# Patient Record
Sex: Female | Born: 1960
Health system: Southern US, Community
[De-identification: ages and names within clinical notes are randomized; demographics above are authoritative.]

## PROBLEM LIST (undated history)

## (undated) DIAGNOSIS — K219 Gastro-esophageal reflux disease without esophagitis: Secondary | ICD-10-CM

## (undated) DIAGNOSIS — D649 Anemia, unspecified: Secondary | ICD-10-CM

## (undated) DIAGNOSIS — K648 Other hemorrhoids: Secondary | ICD-10-CM

## (undated) DIAGNOSIS — I10 Essential (primary) hypertension: Secondary | ICD-10-CM

## (undated) DIAGNOSIS — I700 Atherosclerosis of aorta: Secondary | ICD-10-CM

## (undated) HISTORY — PX: PARTIAL HYSTERECTOMY: SHX80

## (undated) HISTORY — DX: Gastro-esophageal reflux disease without esophagitis: K21.9

## (undated) HISTORY — PX: LUMBAR DISC SURGERY: SHX700

## (undated) HISTORY — DX: Atherosclerosis of aorta: I70.0

## (undated) HISTORY — PX: LUMBAR FUSION: SHX111

## (undated) HISTORY — PX: BACK SURGERY: SHX140

## (undated) HISTORY — DX: Anemia, unspecified: D64.9

## (undated) HISTORY — DX: Essential (primary) hypertension: I10

## (undated) HISTORY — DX: Other hemorrhoids: K64.8

---

## 2004-06-21 ENCOUNTER — Encounter: Admission: RE | Admit: 2004-06-21 | Discharge: 2004-06-21 | Payer: Self-pay | Admitting: Family Medicine

## 2004-11-07 ENCOUNTER — Encounter: Admission: RE | Admit: 2004-11-07 | Discharge: 2004-11-07 | Payer: Self-pay | Admitting: Neurosurgery

## 2004-12-17 ENCOUNTER — Inpatient Hospital Stay (HOSPITAL_COMMUNITY): Admission: RE | Admit: 2004-12-17 | Discharge: 2004-12-22 | Payer: Self-pay | Admitting: Neurosurgery

## 2005-04-07 IMAGING — CT CT L SPINE W/ CM
3 of 7 series · 14 of 33 positions shown, 17 images · non-contrast
Comparison: none

CLINICAL DATA: Previous surgery on the right at L5-S1.  Small recurrent disk herniation.  The patient complains of low back pain and right leg pain. 
 DIAGNOSTIC LUMBAR DISK INJECTIONS:
 The procedure was discussed in depth with the patient including the potential risk of infection.  She received 1 gram of Ancef intravenously prior to the procedure with a small amount withdrawn and added to the contrast for injection.  She received 2 mg of Versed intravenously prior to the procedure.  She was placed prone on the fluoroscopic table.  A Betadine scrub of the low back was performed and the patient was draped in a sterile fashion.  Skin anesthesia was carried [DATE]% Lidocaine. 15 cc 22 gauge Blake San were directed into the nuclear regions of the disks at L4-5 and L5-S1 on the left.  Contrast mixed with Ancef was injected using a pressure monitoring syringe.  Spot radiographs were taken. The patient?s symptoms were recorded.  Following the procedure, she was treated with intravenous Toradol and Fentanyl.  She was taken to CT scan in good condition.  She was observed for an hour after the CT scan and discharged in good condition.

[Series 4: recon 3: l-spine helical · axial · 0.27mm/px · z∈[-261,-168]mm · 6 of 220 slices shown, 8 images]
[im 32/220  soft-tissue]
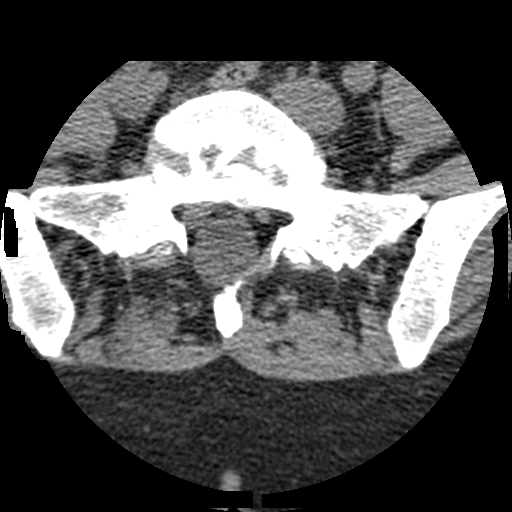
[im 32/220  bone]
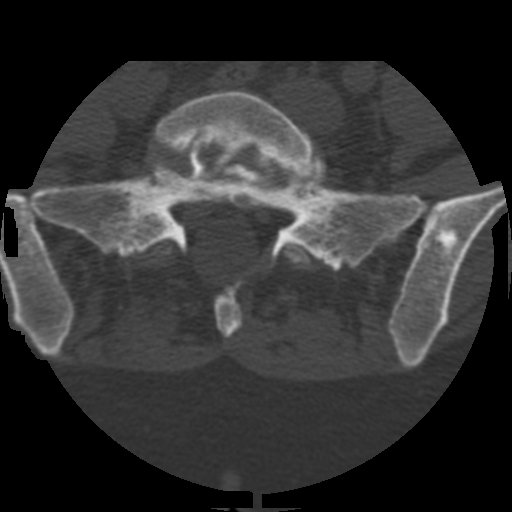
[im 63/220  bone]
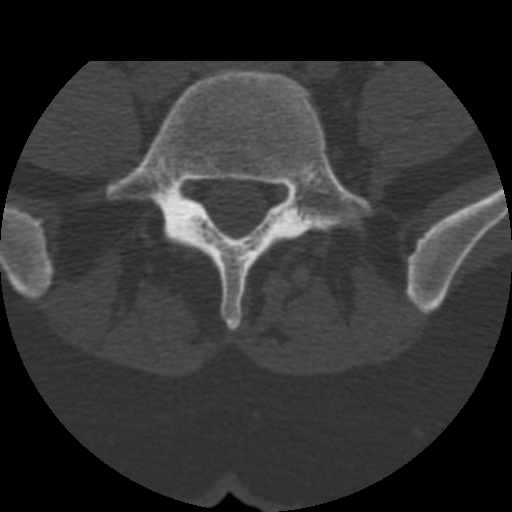
[im 94/220  bone]
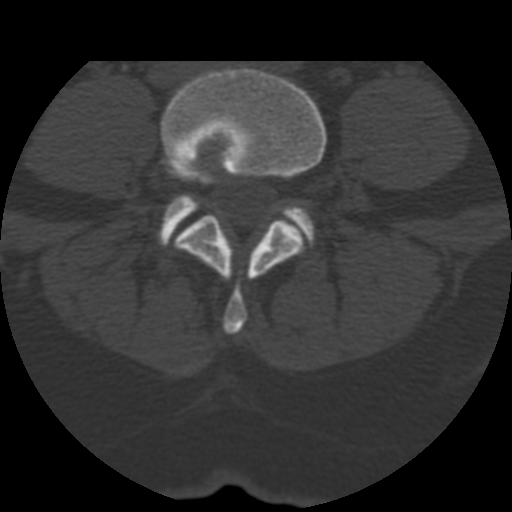
[im 126/220  bone]
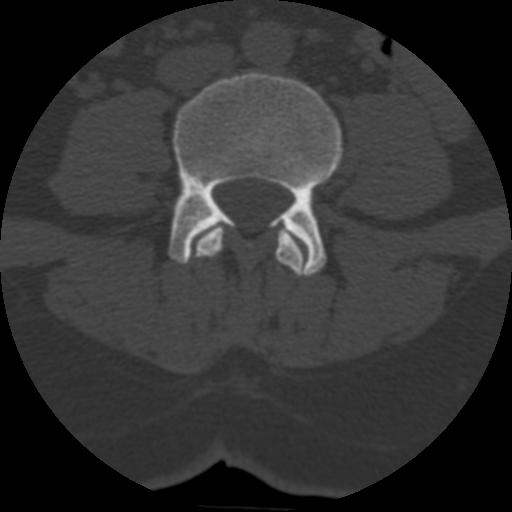
[im 157/220  soft-tissue]
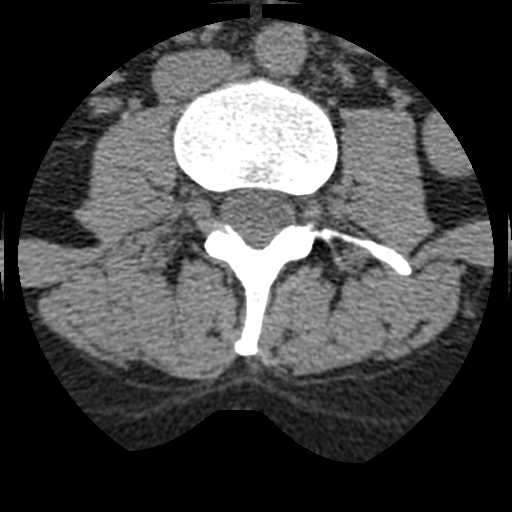
[im 157/220  bone]
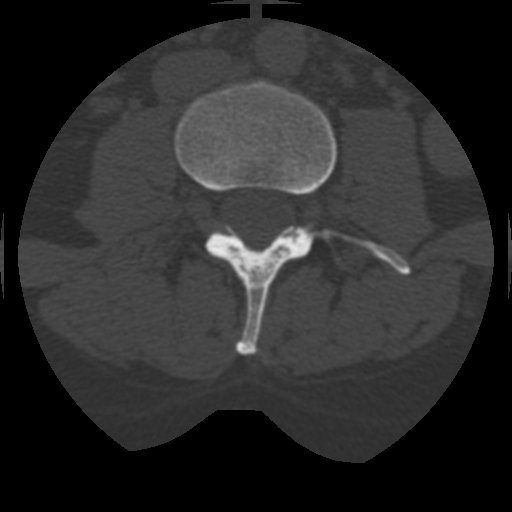
[im 188/220  bone]
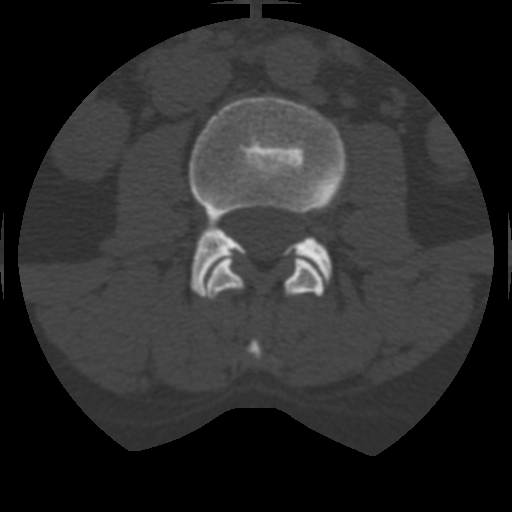

[Series 400: reformatted · sagittal · 0.27mm/px · 5 of 40 slices shown, 6 images (1 of 2)]
[im 14/40  bone]
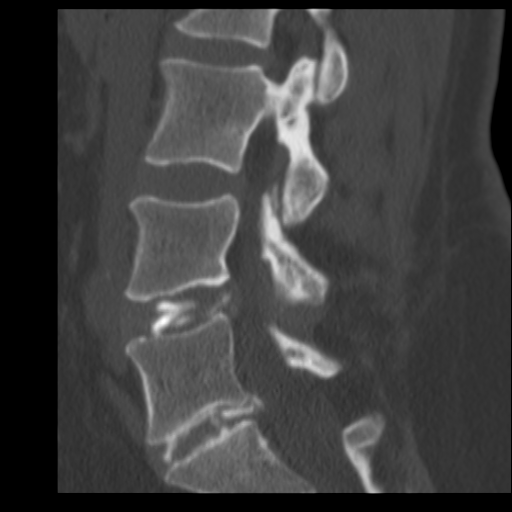
[im 17/40  bone]
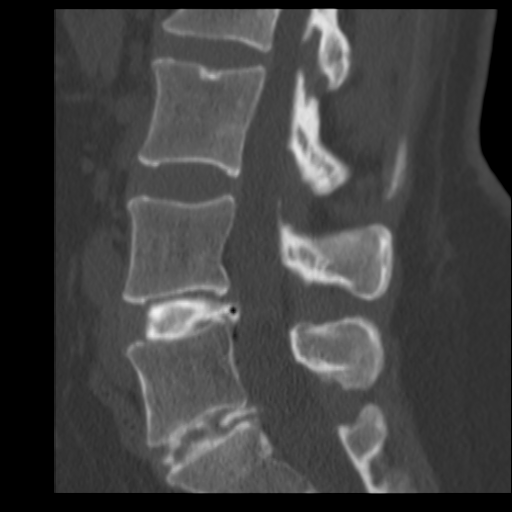
[im 20/40  soft-tissue]
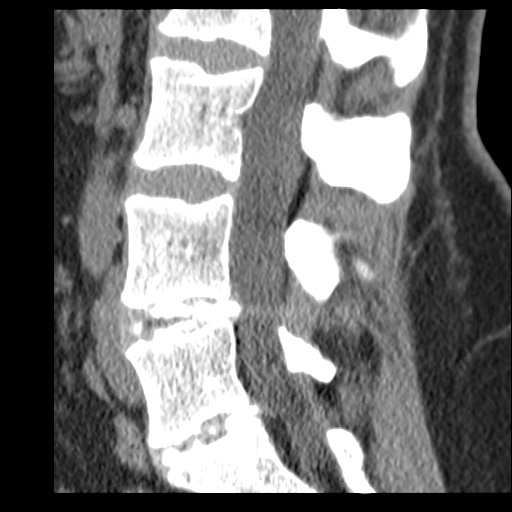
[im 20/40  bone]
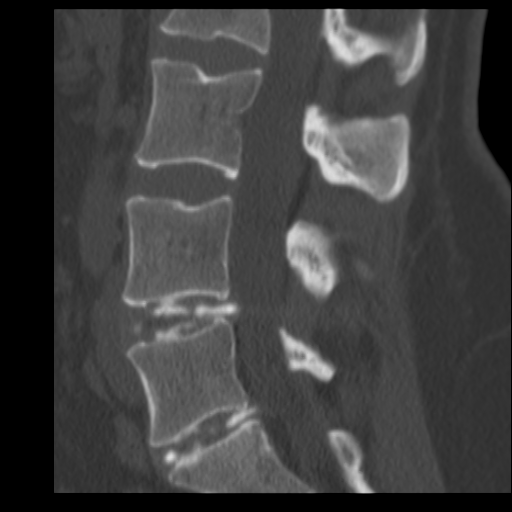
[im 23/40  bone]
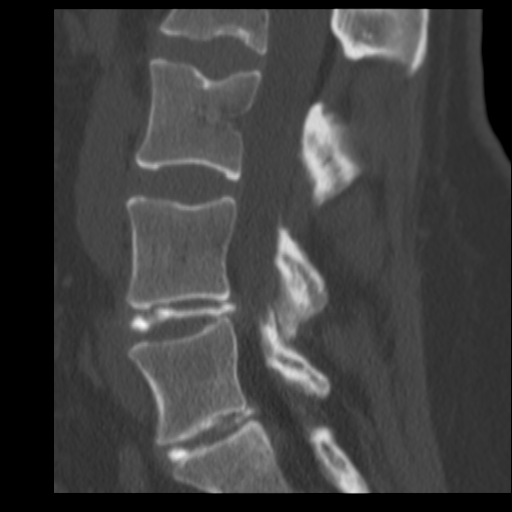
[im 27/40  bone]
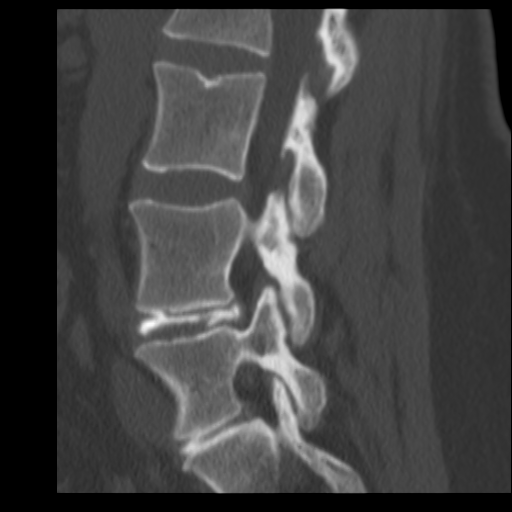

[Series 401: reformatted · coronal · 0.27mm/px · 3 of 40 slices shown (2 of 2)]
[im 8/40  bone]
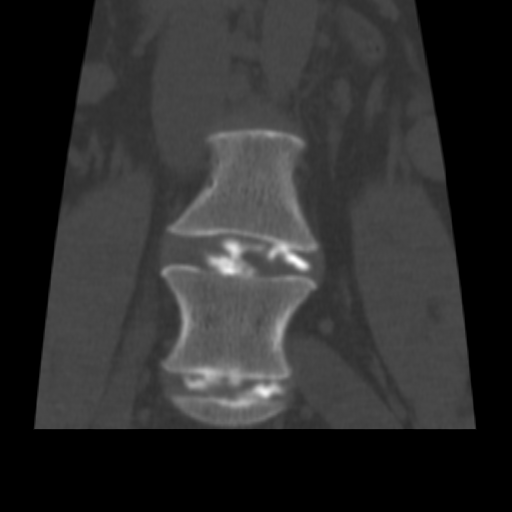
[im 16/40  bone]
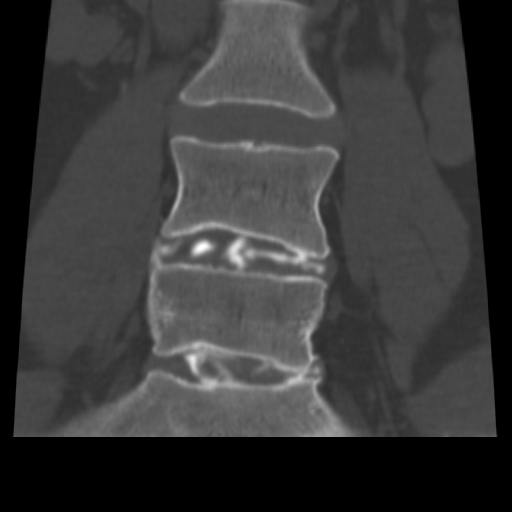
[im 24/40  bone]
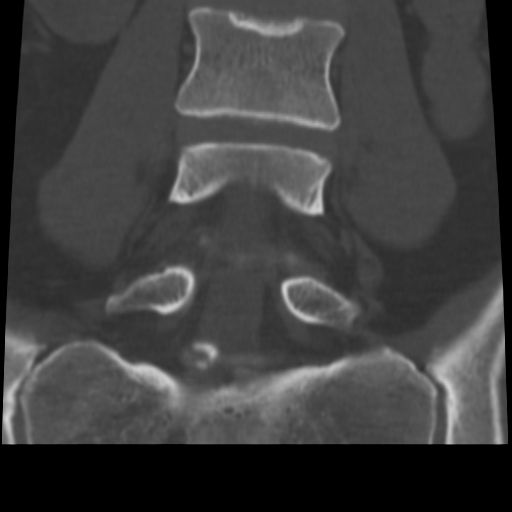

[14 of 33 positions shown; findings below may reference images not displayed]

FINDINGS: L4-5:  Opening pressure was 20 PSI.  This is a diffusely disrupted disk with contrast pervading the nuclear and annular regions circumferentially.  There is extrusion in the right lateral to posterolateral direction.  The disk accommodated 3.5 cc of contrast with elevation of intradiskal pressure to 100 PSI.  The patient felt some back pressure, but did not feel any concordant pain.  
 L5-S1:  Opening pressure was 5 PSI.  I injected 4 or 5 cc of liquid into this joint, but could not raise the intradiskal pressure over about 60 PSI because of the marked disruption and extrusion into the ventral epidural space.  This is a diffusely disrupted disk. There appears to be right posterolateral extrusion of injectate.  The patient felt concordant low back pain.  She felt minimal symptoms in her right leg, but her right leg symptoms were not reproduced conclusively.  Perhaps this was because I could not generate high intradiskal pressures.
IMPRESSION: 1.  Abnormal diskogram at L4-5 with a diffusely disrupted disk showing posterior to right posterolateral extrusion.  No concordant symptoms.  
 2.  L5-S1:  Diffusely disrupted disk with posterior to right posterolateral extrusion.  Concordant low back pain.  I was unable to reproduce her right radicular pain, but because the disk was so disrupted, even with high volumes of injectate, I could not get intradiskal pressure over about 60 or 70 PSI.  My suspicion is that this is the cause of her symptoms. 
 POST DISKOGRAM CT SCAN:
 Spiral scanning was performed from L2 to S1 after diskography.  Sagittal, coronal and para-axial reformations were done.  
 L2-3 and L3-4:  Normal interspaces.
 L4-5:  Diskography was performed at this level.  This was diffusely disrupted disk with contrast pervading the disk space and extending through posterior annular tears essentially from 3 o?clock to 9 o?clock.  There is a shallow broad-based protrusion which indents the thecal sac minimally, but does not appear to result in compressive stenosis.  The facets show mild hypertrophic change. 
 L5-S1:  This is a diffusely disrupted disk.  The patient has had previous partial right hemilaminectomy and diskectomy.  There is a recurrent right posterolateral disk herniation which abuts the right S1 nerve root.  One might expect right S1 symptomatology given this appearance.
IMPRESSION: 1.  Diffusely disrupted disk at L4-5 with annular tearing from 3 o?clock to 9 o?clock and shallow protrusion, but without apparent compressive stenosis.
 2.  Diffusely disrupted disk at L5-S1 with a recurrent right posterolateral protrusion.  Disk material and injectate abut the right S1 nerve root and one might expect right S1 symptomatology given this appearance although the symptoms were not strongly elicited during the injection.  See the previous dictation.

## 2015-09-16 DIAGNOSIS — D126 Benign neoplasm of colon, unspecified: Secondary | ICD-10-CM

## 2015-09-16 HISTORY — DX: Benign neoplasm of colon, unspecified: D12.6

## 2015-10-24 HISTORY — PX: COLONOSCOPY: SHX174

## 2016-01-24 DIAGNOSIS — R600 Localized edema: Secondary | ICD-10-CM | POA: Diagnosis not present

## 2016-01-24 DIAGNOSIS — Z6829 Body mass index (BMI) 29.0-29.9, adult: Secondary | ICD-10-CM | POA: Diagnosis not present

## 2016-03-31 DIAGNOSIS — R05 Cough: Secondary | ICD-10-CM | POA: Diagnosis not present

## 2016-03-31 DIAGNOSIS — Z6828 Body mass index (BMI) 28.0-28.9, adult: Secondary | ICD-10-CM | POA: Diagnosis not present

## 2017-04-10 DIAGNOSIS — Z13220 Encounter for screening for lipoid disorders: Secondary | ICD-10-CM | POA: Diagnosis not present

## 2017-04-10 DIAGNOSIS — Z0000 Encounter for general adult medical examination without abnormal findings: Secondary | ICD-10-CM | POA: Diagnosis not present

## 2017-04-10 DIAGNOSIS — Z1211 Encounter for screening for malignant neoplasm of colon: Secondary | ICD-10-CM | POA: Diagnosis not present

## 2017-04-10 DIAGNOSIS — Z1329 Encounter for screening for other suspected endocrine disorder: Secondary | ICD-10-CM | POA: Diagnosis not present

## 2017-04-10 DIAGNOSIS — Z131 Encounter for screening for diabetes mellitus: Secondary | ICD-10-CM | POA: Diagnosis not present

## 2017-04-10 DIAGNOSIS — Z1389 Encounter for screening for other disorder: Secondary | ICD-10-CM | POA: Diagnosis not present

## 2017-04-10 DIAGNOSIS — Z01419 Encounter for gynecological examination (general) (routine) without abnormal findings: Secondary | ICD-10-CM | POA: Diagnosis not present

## 2017-04-10 DIAGNOSIS — Z1231 Encounter for screening mammogram for malignant neoplasm of breast: Secondary | ICD-10-CM | POA: Diagnosis not present

## 2017-04-17 DIAGNOSIS — Z1231 Encounter for screening mammogram for malignant neoplasm of breast: Secondary | ICD-10-CM | POA: Diagnosis not present

## 2017-04-24 DIAGNOSIS — E785 Hyperlipidemia, unspecified: Secondary | ICD-10-CM | POA: Diagnosis not present

## 2017-04-24 DIAGNOSIS — Z6829 Body mass index (BMI) 29.0-29.9, adult: Secondary | ICD-10-CM | POA: Diagnosis not present

## 2017-04-30 DIAGNOSIS — N6489 Other specified disorders of breast: Secondary | ICD-10-CM | POA: Diagnosis not present

## 2017-04-30 DIAGNOSIS — R928 Other abnormal and inconclusive findings on diagnostic imaging of breast: Secondary | ICD-10-CM | POA: Diagnosis not present

## 2017-04-30 DIAGNOSIS — R922 Inconclusive mammogram: Secondary | ICD-10-CM | POA: Diagnosis not present

## 2018-10-24 DIAGNOSIS — R110 Nausea: Secondary | ICD-10-CM | POA: Diagnosis not present

## 2018-10-24 DIAGNOSIS — R509 Fever, unspecified: Secondary | ICD-10-CM | POA: Diagnosis not present

## 2018-10-24 DIAGNOSIS — R112 Nausea with vomiting, unspecified: Secondary | ICD-10-CM | POA: Diagnosis not present

## 2018-10-24 DIAGNOSIS — I700 Atherosclerosis of aorta: Secondary | ICD-10-CM | POA: Diagnosis not present

## 2018-10-27 DIAGNOSIS — A084 Viral intestinal infection, unspecified: Secondary | ICD-10-CM | POA: Diagnosis not present

## 2019-05-12 DIAGNOSIS — Z6828 Body mass index (BMI) 28.0-28.9, adult: Secondary | ICD-10-CM | POA: Diagnosis not present

## 2019-05-12 DIAGNOSIS — R030 Elevated blood-pressure reading, without diagnosis of hypertension: Secondary | ICD-10-CM | POA: Diagnosis not present

## 2019-05-12 DIAGNOSIS — R42 Dizziness and giddiness: Secondary | ICD-10-CM | POA: Diagnosis not present

## 2019-05-12 DIAGNOSIS — I10 Essential (primary) hypertension: Secondary | ICD-10-CM | POA: Diagnosis not present

## 2019-06-13 DIAGNOSIS — M79671 Pain in right foot: Secondary | ICD-10-CM | POA: Diagnosis not present

## 2019-06-13 DIAGNOSIS — M79672 Pain in left foot: Secondary | ICD-10-CM | POA: Diagnosis not present

## 2019-06-13 DIAGNOSIS — L84 Corns and callosities: Secondary | ICD-10-CM | POA: Diagnosis not present

## 2019-06-23 ENCOUNTER — Other Ambulatory Visit: Payer: Self-pay | Admitting: Sports Medicine

## 2019-06-23 DIAGNOSIS — L84 Corns and callosities: Secondary | ICD-10-CM

## 2019-06-24 ENCOUNTER — Other Ambulatory Visit: Payer: Self-pay

## 2019-06-24 ENCOUNTER — Ambulatory Visit: Payer: BC Managed Care – PPO | Admitting: Sports Medicine

## 2019-06-24 ENCOUNTER — Encounter: Payer: Self-pay | Admitting: Sports Medicine

## 2019-06-24 ENCOUNTER — Ambulatory Visit (INDEPENDENT_AMBULATORY_CARE_PROVIDER_SITE_OTHER): Payer: BC Managed Care – PPO

## 2019-06-24 DIAGNOSIS — L84 Corns and callosities: Secondary | ICD-10-CM

## 2019-06-24 DIAGNOSIS — M7751 Other enthesopathy of right foot: Secondary | ICD-10-CM | POA: Diagnosis not present

## 2019-06-24 DIAGNOSIS — M7752 Other enthesopathy of left foot: Secondary | ICD-10-CM | POA: Diagnosis not present

## 2019-06-24 DIAGNOSIS — M779 Enthesopathy, unspecified: Secondary | ICD-10-CM

## 2019-06-24 DIAGNOSIS — R600 Localized edema: Secondary | ICD-10-CM | POA: Diagnosis not present

## 2019-06-24 DIAGNOSIS — M79671 Pain in right foot: Secondary | ICD-10-CM

## 2019-06-24 DIAGNOSIS — M79672 Pain in left foot: Secondary | ICD-10-CM

## 2019-06-24 MED ORDER — TRIAMCINOLONE ACETONIDE 10 MG/ML IJ SUSP
10.0000 mg | Freq: Once | INTRAMUSCULAR | Status: AC
  Administered 2019-06-24: 10 mg

## 2019-06-24 NOTE — Progress Notes (Signed)
Subjective: Alicia David is a 58 y.o. female patient who presents to office for evaluation of Right= Left foot pain secondary to callus skin. 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 Patient denies any other pedal complaints.   Review of Systems  All other systems reviewed and are negative.    There are no active problems to display for this patient.   No current outpatient medications on file prior to visit.   No current facility-administered medications on file prior to visit.     Not on File  Objective:  General: Alert and oriented x3 in no acute distress  Dermatology: Keratotic lesion present sub-met 5 bilateral with skin lines transversing the lesion, pain is present with direct pressure to the lesion with a central nucleated core noted, mild reactive callus first toes bilateral likely secondary to patient walking differently because she is compensating for the pain on the lateral sides of both feet, no webspace macerations, no ecchymosis bilateral, all nails x 10 are well manicured.  Vascular: Dorsalis Pedis and Posterior Tibial pedal pulses 1/4, Capillary Fill Time 3 seconds, + pedal hair growth bilateral, trace pitting edema left greater than right, temperature gradient within normal limits.  Neurology: Johney Maine sensation intact via light touch bilateral.  Musculoskeletal: Severe tenderness with palpation at the keratotic lesion site on right and left sub-met fifth bilateral muscular strength 5/5 in all groups without pain or limitation on range of  motion.  Fat pad atrophy with prominent fifth metatarsal head noted bilateral.  X-rays right and left foot mild plantarflexed fifth metatarsal head, areas of diffuse arthritis at midtarsal joint and ankle, no fracture or dislocation, mild hammertoe at fifth toe bilateral, no other acute findings.  Assessment and Plan: Problem List Items Addressed This Visit    None    Visit Diagnoses    Bilateral foot pain    -  Primary   Callus       Capsulitis       Edema, lower extremity         -Complete examination performed -X-rays reviewed -Discussed treatment options -After oral consent and aseptic prep, injected a mixture containing 1 ml of 2%  plain lidocaine, 1 ml 0.5% plain marcaine, 0.5 ml of kenalog 10 and 0.5 ml of dexamethasone phosphate into fifth metatarsophalangeal joints bilateral without complication. Post-injection care discussed with patient.  -Parred keratoic lesion using a chisel/15 blade; treated the area withSalinocaine covered with moleskin -Encouraged daily skin emollients -Encouraged use of pumice stone -Advised good supportive shoes and offloading pads as dispensed advised patient that this works well may benefit long-term from custom insoles -Work excuse given for today may resume normal work schedule Monday -Dispensed surgitube compression sleeves for patient to wear bilateral and advised patient to get compression garments from elastic therapy or use the one she has at home if she could find them -Patient to return to office after 1 month or sooner if condition worsens.  Landis Martins, DPM

## 2019-06-27 ENCOUNTER — Other Ambulatory Visit: Payer: Self-pay | Admitting: Sports Medicine

## 2019-06-27 DIAGNOSIS — M779 Enthesopathy, unspecified: Secondary | ICD-10-CM

## 2019-07-22 ENCOUNTER — Ambulatory Visit: Payer: BC Managed Care – PPO | Admitting: Sports Medicine

## 2019-10-07 DIAGNOSIS — J3489 Other specified disorders of nose and nasal sinuses: Secondary | ICD-10-CM | POA: Diagnosis not present

## 2019-10-07 DIAGNOSIS — Z20828 Contact with and (suspected) exposure to other viral communicable diseases: Secondary | ICD-10-CM | POA: Diagnosis not present

## 2019-10-12 DIAGNOSIS — Z20822 Contact with and (suspected) exposure to covid-19: Secondary | ICD-10-CM | POA: Diagnosis not present

## 2019-10-19 DIAGNOSIS — U071 COVID-19: Secondary | ICD-10-CM | POA: Diagnosis not present

## 2019-11-25 DIAGNOSIS — M542 Cervicalgia: Secondary | ICD-10-CM | POA: Diagnosis not present

## 2019-11-25 DIAGNOSIS — S199XXA Unspecified injury of neck, initial encounter: Secondary | ICD-10-CM | POA: Diagnosis not present

## 2020-07-27 ENCOUNTER — Encounter: Payer: Self-pay | Admitting: Sports Medicine

## 2020-07-27 ENCOUNTER — Ambulatory Visit: Payer: BC Managed Care – PPO | Admitting: Sports Medicine

## 2020-07-27 ENCOUNTER — Ambulatory Visit (INDEPENDENT_AMBULATORY_CARE_PROVIDER_SITE_OTHER): Payer: BC Managed Care – PPO

## 2020-07-27 ENCOUNTER — Other Ambulatory Visit: Payer: Self-pay

## 2020-07-27 DIAGNOSIS — L84 Corns and callosities: Secondary | ICD-10-CM

## 2020-07-27 DIAGNOSIS — M7660 Achilles tendinitis, unspecified leg: Secondary | ICD-10-CM

## 2020-07-27 DIAGNOSIS — M7662 Achilles tendinitis, left leg: Secondary | ICD-10-CM | POA: Diagnosis not present

## 2020-07-27 DIAGNOSIS — M79671 Pain in right foot: Secondary | ICD-10-CM

## 2020-07-27 DIAGNOSIS — M79672 Pain in left foot: Secondary | ICD-10-CM | POA: Diagnosis not present

## 2020-07-27 MED ORDER — METHYLPREDNISOLONE 4 MG PO TBPK: ORAL_TABLET | ORAL | 0 refills | Status: DC

## 2020-07-27 NOTE — Patient Instructions (Signed)
[employer]plus insoles from [employer]

## 2020-07-27 NOTE — Progress Notes (Signed)
Subjective: Alicia David is a 59 y.o. female patient who presents to office for evaluation of Left heel pain. Patient complains of progressive pain especially over the last month in the Left foot at the Achilles at the area of a Knot. Reports that the back of the heel burns throb take it is still very tender for the last month. Patient also complains of some pain at the callus areas to both feet especially the right foot. Patient denies any other pedal complaints.   There are no problems to display for this patient.   No current outpatient medications on file prior to visit.   No current facility-administered medications on file prior to visit.    Not on File  Objective:  General: Alert and oriented x3 in no acute distress  Dermatology: No open lesions bilateral lower extremities, no webspace macerations, no ecchymosis bilateral, all nails x 10 are well manicured. Reactive keratosis submet 5 bilateral anterior medial hallux bilateral.  Vascular: Dorsalis Pedis and Posterior Tibial pedal pulses 1/4, Capillary Fill Time 3 seconds, + pedal hair growth bilateral, no edema bilateral lower extremities, Temperature gradient within normal limits.  Neurology: Johney Maine sensation intact via light touch bilateral.  Musculoskeletal: Mild tenderness with palpation at insertion of the Achilles on left, there is calcaneal exostosis with mild soft tissue present at left heel and decreased ankle rom with knee extending  vs flexed resembling gastroc equnius bilateral, The achilles tendon feels intact with mild nodularity but no palpable dell, Thompson sign negative, Subtalar and midtarsal joint range of motion is within normal limits, there is no 1st ray hypermobility or forefoot deformity noted bilateral.   Xrays  Left Foot    Impression: Normal osseous mineralization. Joint spaces preserved. No fracture/dislocation/boney destruction. Calcaneal spur present. Kager's triangle intact with no obliteration. No  soft tissue abnormalities or radiopaque foreign bodies.   Assessment and Plan: Problem List Items Addressed This Visit    None    Visit Diagnoses    Achilles tendon pain    -  Primary   Relevant Orders   DG Foot Complete Left   Callus       Bilateral foot pain       Pain of left heel          -Complete examination performed -Xrays reviewed -Discussed treatement options -Rx Medrol, recommend over-the-counter air plus insoles, gentle stretching with night splint as dispensed this visit -No improvement will consider MRI/PT/EPAT -Mechanically debrided calluses x4 using a sterile 15 blade without incident and applied Salinocaine and Band-Aid dressing -Dispensed sample of foot miracle cream -Patient to return to office in 1 month for follow-up on Achilles or sooner if condition worsens.  Landis Martins, DPM

## 2020-08-13 DIAGNOSIS — R030 Elevated blood-pressure reading, without diagnosis of hypertension: Secondary | ICD-10-CM | POA: Diagnosis not present

## 2020-08-13 DIAGNOSIS — J069 Acute upper respiratory infection, unspecified: Secondary | ICD-10-CM | POA: Diagnosis not present

## 2020-08-13 DIAGNOSIS — Z1152 Encounter for screening for COVID-19: Secondary | ICD-10-CM | POA: Diagnosis not present

## 2020-08-29 ENCOUNTER — Ambulatory Visit: Payer: BC Managed Care – PPO | Admitting: Sports Medicine

## 2020-09-18 DIAGNOSIS — Z90710 Acquired absence of both cervix and uterus: Secondary | ICD-10-CM | POA: Diagnosis not present

## 2020-09-18 DIAGNOSIS — Z0000 Encounter for general adult medical examination without abnormal findings: Secondary | ICD-10-CM | POA: Diagnosis not present

## 2020-11-14 DIAGNOSIS — Z1231 Encounter for screening mammogram for malignant neoplasm of breast: Secondary | ICD-10-CM | POA: Diagnosis not present

## 2021-04-15 ENCOUNTER — Encounter: Payer: Self-pay | Admitting: Gastroenterology

## 2021-04-30 DIAGNOSIS — R509 Fever, unspecified: Secondary | ICD-10-CM | POA: Diagnosis not present

## 2021-04-30 DIAGNOSIS — J069 Acute upper respiratory infection, unspecified: Secondary | ICD-10-CM | POA: Diagnosis not present

## 2022-04-08 ENCOUNTER — Encounter: Payer: Self-pay | Admitting: Nurse Practitioner

## 2022-05-05 ENCOUNTER — Encounter: Payer: Self-pay | Admitting: *Deleted

## 2022-05-09 ENCOUNTER — Other Ambulatory Visit (INDEPENDENT_AMBULATORY_CARE_PROVIDER_SITE_OTHER): Payer: BC Managed Care – PPO

## 2022-05-09 ENCOUNTER — Ambulatory Visit: Payer: BC Managed Care – PPO | Admitting: Nurse Practitioner

## 2022-05-09 ENCOUNTER — Encounter: Payer: Self-pay | Admitting: Nurse Practitioner

## 2022-05-09 VITALS — BP 130/78 | HR 56 | Ht 64.0 in | Wt 164.0 lb

## 2022-05-09 DIAGNOSIS — K529 Noninfective gastroenteritis and colitis, unspecified: Secondary | ICD-10-CM

## 2022-05-09 DIAGNOSIS — Z86010 Personal history of colonic polyps: Secondary | ICD-10-CM

## 2022-05-09 DIAGNOSIS — R197 Diarrhea, unspecified: Secondary | ICD-10-CM | POA: Diagnosis not present

## 2022-05-09 LAB — SEDIMENTATION RATE: Sed Rate: 34 mm/hr — ABNORMAL HIGH (ref 0–30)

## 2022-05-09 LAB — TSH: TSH: 1.41 u[IU]/mL (ref 0.35–5.50)

## 2022-05-09 LAB — HIGH SENSITIVITY CRP: CRP, High Sensitivity: 3 mg/L (ref 0.000–5.000)

## 2022-05-09 MED ORDER — DICYCLOMINE HCL 10 MG PO CAPS: 10.0000 mg | ORAL_CAPSULE | Freq: Three times a day (TID) | ORAL | 1 refills | Status: DC

## 2022-05-09 NOTE — Patient Instructions (Addendum)
You have been scheduled for follow up with Dr Lyndel Safe on 07/11/22 at 9:30 am.  We have sent the following medications to your pharmacy for you to pick up at your convenience: Bentyl 10 mg before meals 3 times daily.  Your provider has requested that you go to the basement level for lab work before leaving today. Press "B" on the elevator. The lab is located at the first door on the left as you exit the elevator.  _______________________________________________________  If you are age 46 or older, your body mass index should be between 23-30. Your Body mass index is 28.15 kg/m. If this is out of the aforementioned range listed, please consider follow up with your Primary Care Provider.  If you are age 50 or younger, your body mass index should be between 19-25. Your Body mass index is 28.15 kg/m. If this is out of the aformentioned range listed, please consider follow up with your Primary Care Provider.   ________________________________________________________  The [facility] providers would like to encourage you to use [facility] to communicate with providers for non-urgent requests or questions.  Due to long hold times on the telephone, sending your provider a message by [facility] may be a faster and more efficient way to get a response.  Please allow 48 business hours for a response.  Please remember that this is for non-urgent requests.  _______________________________________________________ Due to recent changes in healthcare laws, you may see the results of your imaging and laboratory studies on MyChart before your provider has had a chance to review them.  We understand that in some cases there may be results that are confusing or concerning to you. Not all laboratory results come back in the same time frame and the provider may be waiting for multiple results in order to interpret others.  Please give [place] 48 hours in order for your provider to thoroughly review all the results before contacting  the office for clarification of your results.

## 2022-05-09 NOTE — Progress Notes (Signed)
Chief Complaint:  bowel changes   Assessment & Plan   # 61 yo female with bowel changes. Having frequent postprandial loose stools and pre-defecatory cramps over the last 1.5 years. Having normal formed BMs as well. No alarm features such as weight loss or blood in stool. Doubt infectious. Functional diarrhea? Bile acid diarrhea? EPI? Doesn't seem like microscopic colitis  but will keep in mind.  Check tTg, IgA, ESR, CRP, CBC, BMET. Stool for lactoferrin, giardia ag Trial of bentyl ac TID. If no improvement after several days she can discontinue Follow up with me or Dr. Lyndel David in 6-8 weeks.   # History of colon polyps. She had a <10 mm tubular adenoma removed in 2017.  Based on updated surveillance guidelines she would need a 7-year recall colonoscopy in Feb 2024.   HPI:    Alicia David is a 61 y.o. year old female with a past medical history of hypertension and colon polyps.  She was Dr. Steve David patient in [place], last seen in 2017 for colonoscopy.   Screening colonoscopy February 2017 by Dr. Lyndel David. Complete exam, good prep.  8 mm sessile polyp removed from the distal transverse colon.  Small internal hemorrhoids.  Polyp path-tubular adenoma.  Interval history Alicia David referred herself here for a 1.5 year history of postprandial diarrhea which is often associated with urgency.  She gets assocated crampy lower abdominal pain relieved with defecation. She does have solid stools several times a week She knows many of the culprit food but the list is so long that it would be restrictive to  Gluten nor dairy seem to be problematic. She hasn't tried anything for the diarrhea .She cannot correlate the onset of postprandial diarrhea 1.5 years to diet changes. She doesn't take any medication.  No blood in stool.  Her weight is stable. On well water. Husband not having same symptoms   No recent labs   Past Medical History:  Diagnosis Date   Anemia    Aortic atherosclerosis (HCC)     GERD (gastroesophageal reflux disease)    High blood pressure    Internal hemorrhoids    Tubular adenoma of colon 2017   Past Surgical History:  Procedure Laterality Date   LUMBAR DISC SURGERY     LUMBAR FUSION     PARTIAL HYSTERECTOMY     Family History  Problem Relation Age of Onset   Alzheimer's disease Mother    Heart attack Father    Lymphoma Sister    Lung cancer Brother    High blood pressure Brother    Colon cancer Neg Hx    Social History   Tobacco Use   Smoking status: Never   Smokeless tobacco: Never  Vaping Use   Vaping Use: Never used  Substance Use Topics   Alcohol use: Not Currently   Drug use: Never   Current Outpatient Medications  Medication Sig Dispense Refill   famotidine (PEPCID) 10 MG tablet Take 10 mg by mouth daily.     lisinopril (ZESTRIL) 10 MG tablet Take 10 mg by mouth daily.     No current facility-administered medications for this visit.   No Known Allergies   Review of Systems: Positive for sleeping problems.  All other systems reviewed and negative except where noted in HPI.   Wt Readings from Last 3 Encounters:  05/09/22 164 lb (74.4 kg)    Physical Exam   BP 130/78   Pulse (!) 56   Ht _0  (1.626 m)  Wt 164 lb (74.4 kg)   SpO2 100%   BMI 28.15 kg/m  Constitutional:  Generally well appearing female in no acute distress. Psychiatric: Pleasant. Normal mood and affect. Behavior is normal. EENT: Pupils normal.  Conjunctivae are normal. No scleral icterus. Neck supple.  Cardiovascular: Normal rate, regular rhythm. No edema Pulmonary/chest: Effort normal and breath sounds normal. No wheezing, rales or rhonchi. Abdominal: Soft, nondistended, nontender. Bowel sounds active throughout. There are no masses palpable. No hepatomegaly. Neurological: Alert and oriented to person place and time. Skin: Skin is warm and dry. No rashes noted.  Alicia Savoy, NP  05/09/2022, 11:58 AM

## 2022-05-17 NOTE — Progress Notes (Signed)
Agree with assessment/plan.  Raj Alyissa Whidbee, MD [facility] [phone]  

## 2022-06-06 ENCOUNTER — Other Ambulatory Visit: Payer: BC Managed Care – PPO

## 2022-06-06 ENCOUNTER — Other Ambulatory Visit: Payer: Self-pay

## 2022-06-06 DIAGNOSIS — K529 Noninfective gastroenteritis and colitis, unspecified: Secondary | ICD-10-CM

## 2022-06-06 DIAGNOSIS — Z86010 Personal history of colonic polyps: Secondary | ICD-10-CM

## 2022-06-09 LAB — GIARDIA ANTIGEN
MICRO NUMBER:: 13955743
RESULT:: NOT DETECTED
SPECIMEN QUALITY:: ADEQUATE

## 2022-07-11 ENCOUNTER — Ambulatory Visit: Payer: BC Managed Care – PPO | Admitting: Gastroenterology

## 2022-07-11 ENCOUNTER — Encounter: Payer: Self-pay | Admitting: Gastroenterology

## 2022-07-11 VITALS — BP 132/78 | HR 62 | Ht 64.0 in | Wt 169.5 lb

## 2022-07-11 DIAGNOSIS — K580 Irritable bowel syndrome with diarrhea: Secondary | ICD-10-CM

## 2022-07-11 DIAGNOSIS — Z86010 Personal history of colonic polyps: Secondary | ICD-10-CM | POA: Diagnosis not present

## 2022-07-11 MED ORDER — DICYCLOMINE HCL 10 MG PO CAPS: 10.0000 mg | ORAL_CAPSULE | Freq: Two times a day (BID) | ORAL | 6 refills | Status: AC

## 2022-07-11 NOTE — Patient Instructions (Signed)
_______________________________________________________  If you are age 61 or older, your body mass index should be between 23-30. Your Body mass index is 29.09 kg/m. If this is out of the aforementioned range listed, please consider follow up with your Primary Care Provider.  If you are age 40 or younger, your body mass index should be between 19-25. Your Body mass index is 29.09 kg/m. If this is out of the aformentioned range listed, please consider follow up with your Primary Care Provider.   ________________________________________________________  The [facility] providers would like to encourage you to use [facility] to communicate with providers for non-urgent requests or questions.  Due to long hold times on the telephone, sending your provider a message by [facility] may be a faster and more efficient way to get a response.  Please allow 48 business hours for a response.  Please remember that this is for non-urgent requests.  _______________________________________________________  [name] have been scheduled for a colonoscopy. Please follow written instructions given to you at your visit today.  Please pick up your prep supplies at the pharmacy within the next 1-3 days. If you use inhalers (even only as needed), please bring them with you on the day of your procedure.  We have sent the following medications to your pharmacy for you to pick up at your convenience: Bentyl  Please call with any questions or concerns.  Thank you,  Dr. Jackquline Denmark

## 2022-07-11 NOTE — Progress Notes (Signed)
Chief Complaint: For colonoscopy  Referring Provider:  Greig Right, MD      ASSESSMENT AND PLAN;   #1. H/O polyps  #2. IBS-D  Plan: -Colon (any Monday) -Bentyl '10mg'$  po BID #60   Discussed risks & benefits of colonoscopy. Risks including rare perforation req laparotomy, bleeding after bx/polypectomy req blood transfusion, rarely missing neoplasms, risks of anesthesia/sedation, rare risk of damage to internal organs. Benefits outweigh the risks. Patient agrees to proceed. All the questions were answered. Pt consents to proceed. HPI:    Alicia David is a 61 y.o. female  For follow-up visit.  Doing well.  Has longstanding intermittent postprandial diarrhea especially after eating salads associated with mild abdominal bloating, lower abdominal discomfort which gets better with medication.  Has been diagnosed with IBS-D many years ago.  Had taken Bentyl with good relief.  No melena or hematochezia.  No nocturnal symptoms.  No weight loss.  In fact she has gained weight.  She would like to have colonoscopy done.    Past GI work-up Colonoscopy 11/03/2015 ([facility]) -Colonic polyp s/p polypectomy. Bx- TA -Small internal hemorrhoids -Repeat in 5 yrs. Past Medical History:  Diagnosis Date   Anemia    Aortic atherosclerosis (HCC)    GERD (gastroesophageal reflux disease)    High blood pressure    Internal hemorrhoids    Tubular adenoma of colon 2017    Past Surgical History:  Procedure Laterality Date   COLONOSCOPY  10/24/2015   Colonic polyp statis post polypectomy. Small internal hemorrhoids   LUMBAR DISC SURGERY     LUMBAR FUSION     PARTIAL HYSTERECTOMY      Family History  Problem Relation Age of Onset   Alzheimer's disease Mother    Heart attack Father    Lymphoma Sister    Lung cancer Brother    High blood pressure Brother    Colon cancer Neg Hx     Social History   Tobacco Use   Smoking status: Never   Smokeless tobacco: Never  Vaping Use    Vaping Use: Never used  Substance Use Topics   Alcohol use: Not Currently   Drug use: Never    Current Outpatient Medications  Medication Sig Dispense Refill   famotidine (PEPCID) 10 MG tablet Take 10 mg by mouth daily.     lisinopril (ZESTRIL) 10 MG tablet Take 10 mg by mouth daily.     dicyclomine (BENTYL) 10 MG capsule Take 1 capsule (10 mg total) by mouth 3 (three) times daily before meals. (Patient not taking: Reported on 07/11/2022) 90 capsule 1   No current facility-administered medications for this visit.    No Known Allergies  Review of Systems:  Constitutional: Denies fever, chills, diaphoresis, appetite change and fatigue.  HEENT: Denies photophobia, eye pain, redness, hearing loss, ear pain, congestion, sore throat, rhinorrhea, sneezing, mouth sores, neck pain, neck stiffness and tinnitus.   Respiratory: Denies SOB, DOE, cough, chest tightness,  and wheezing.   Cardiovascular: Denies chest pain, palpitations and leg swelling.  Genitourinary: Denies dysuria, urgency, frequency, hematuria, flank pain and difficulty urinating.  Musculoskeletal: Denies myalgias, back pain, joint swelling, arthralgias and gait problem.  Skin: No rash.  Neurological: Denies dizziness, seizures, syncope, weakness, light-headedness, numbness and headaches.  Hematological: Denies adenopathy. Easy bruising, personal or family bleeding history  Psychiatric/Behavioral: Situational anxiety, no depression     Physical Exam:    BP 132/78   Pulse 62   Ht '5\' 4"'$  (1.626 m)  Wt 169 lb 8 oz (76.9 kg)   BMI 29.09 kg/m  Wt Readings from Last 3 Encounters:  07/11/22 169 lb 8 oz (76.9 kg)  05/09/22 164 lb (74.4 kg)   Constitutional:  Well-developed, in no acute distress. Psychiatric: Normal mood and affect. Behavior is normal. HEENT: Pupils normal.  Conjunctivae are normal. No scleral icterus. Neck supple.  Cardiovascular: Normal rate, regular rhythm. No edema Pulmonary/chest: Effort normal and  breath sounds normal. No wheezing, rales or rhonchi. Abdominal: Soft, nondistended. Nontender. Bowel sounds active throughout. There are no masses palpable. No hepatomegaly. Rectal: Deferred Neurological: Alert and oriented to person place and time. Skin: Skin is warm and dry. No rashes noted.    Carmell Austria, MD 07/11/2022, 10:13 AM  Cc: Greig Right, MD

## 2022-08-15 ENCOUNTER — Encounter: Payer: Self-pay | Admitting: Gastroenterology

## 2022-08-25 ENCOUNTER — Encounter: Payer: Self-pay | Admitting: Gastroenterology

## 2022-08-25 ENCOUNTER — Ambulatory Visit (AMBULATORY_SURGERY_CENTER): Payer: BC Managed Care – PPO | Admitting: Gastroenterology

## 2022-08-25 VITALS — BP 131/66 | HR 63 | Temp 97.7°F | Resp 11 | Ht 64.0 in | Wt 169.0 lb

## 2022-08-25 DIAGNOSIS — Z86010 Personal history of colonic polyps: Secondary | ICD-10-CM | POA: Diagnosis not present

## 2022-08-25 DIAGNOSIS — Z09 Encounter for follow-up examination after completed treatment for conditions other than malignant neoplasm: Secondary | ICD-10-CM

## 2022-08-25 MED ORDER — SODIUM CHLORIDE 0.9 % IV SOLN: 500.0000 mL | INTRAVENOUS | Status: AC

## 2022-08-25 NOTE — Patient Instructions (Signed)
Impression/Recommendations:  Hemorrhoid and diverticulosis handouts given to patient.  Resume previous diet. Continue present medications.  Repeat colonoscopy in 10 years for screening purposes, or earlier if indicated.  YOU HAD AN ENDOSCOPIC PROCEDURE TODAY AT [facility]:   Refer to the procedure report that was given to you for any specific questions about what was found during the examination.  If the procedure report does not answer your questions, please call your gastroenterologist to clarify.  If you requested that your care partner not be given the details of your procedure findings, then the procedure report has been included in a sealed envelope for you to review at your convenience later.  YOU SHOULD EXPECT: Some feelings of bloating in the abdomen. Passage of more gas than usual.  Walking can help get rid of the air that was put into your GI tract during the procedure and reduce the bloating. If you had a lower endoscopy (such as a colonoscopy or flexible sigmoidoscopy) you may notice spotting of blood in your stool or on the toilet paper. If you underwent a bowel prep for your procedure, you may not have a normal bowel movement for a few days.  Please Note:  You might notice some irritation and congestion in your nose or some drainage.  This is from the oxygen used during your procedure.  There is no need for concern and it should clear up in a day or so.  SYMPTOMS TO REPORT IMMEDIATELY:  Following lower endoscopy (colonoscopy or flexible sigmoidoscopy):  Excessive amounts of blood in the stool  Significant tenderness or worsening of abdominal pains  Swelling of the abdomen that is new, acute  Fever of 100F or higher   For urgent or emergent issues, a gastroenterologist can be reached at any hour by calling [phone]. Do not use MyChart messaging for urgent concerns.    DIET:  We do recommend a small meal at first, but then you may proceed to your  regular diet.  Drink plenty of fluids but you should avoid alcoholic beverages for 24 hours.  ACTIVITY:  You should plan to take it easy for the rest of today and you should NOT DRIVE or use heavy machinery until tomorrow (because of the sedation medicines used during the test).    FOLLOW UP: Our staff will call the number listed on your records the next business day following your procedure.  We will call around 7:15- 8:00 am to check on you and address any questions or concerns that you may have regarding the information given to you following your procedure. If we do not reach you, we will leave a message.     If any biopsies were taken you will be contacted by phone or by letter within the next 1-3 weeks.  Please call us at [phone] if you have not heard about the biopsies in 3 weeks.    SIGNATURES/CONFIDENTIALITY: You and/or your care partner have signed paperwork which will be entered into your electronic medical record.  These signatures attest to the fact that that the information above on your After Visit Summary has been reviewed and is understood.  Full responsibility of the confidentiality of this discharge information lies with you and/or your care-partner.

## 2022-08-25 NOTE — Progress Notes (Signed)
Chief Complaint: For colonoscopy  Referring Provider:  Greig Right, MD      ASSESSMENT AND PLAN;   #1. H/O polyps  #2. IBS-D  Plan: -Colon (any Monday) -Bentyl '10mg'$  po BID #60   Discussed risks & benefits of colonoscopy. Risks including rare perforation req laparotomy, bleeding after bx/polypectomy req blood transfusion, rarely missing neoplasms, risks of anesthesia/sedation, rare risk of damage to internal organs. Benefits outweigh the risks. Patient agrees to proceed. All the questions were answered. Pt consents to proceed. HPI:    Alicia David is a 61 y.o. female  For follow-up visit.  Doing well.  Has longstanding intermittent postprandial diarrhea especially after eating salads associated with mild abdominal bloating, lower abdominal discomfort which gets better with medication.  Has been diagnosed with IBS-D many years ago.  Had taken Bentyl with good relief.  No melena or hematochezia.  No nocturnal symptoms.  No weight loss.  In fact she has gained weight.  She would like to have colonoscopy done.    Past GI work-up Colonoscopy 11/03/2015 ([facility]) -Colonic polyp s/p polypectomy. Bx- TA -Small internal hemorrhoids -Repeat in 5 yrs. Past Medical History:  Diagnosis Date   Anemia    Aortic atherosclerosis (HCC)    GERD (gastroesophageal reflux disease)    High blood pressure    Internal hemorrhoids    Tubular adenoma of colon 2017    Past Surgical History:  Procedure Laterality Date   COLONOSCOPY  10/24/2015   Colonic polyp statis post polypectomy. Small internal hemorrhoids   LUMBAR DISC SURGERY     LUMBAR FUSION     PARTIAL HYSTERECTOMY      Family History  Problem Relation Age of Onset   Alzheimer's disease Mother    Heart attack Father    Lymphoma Sister    Lung cancer Brother    High blood pressure Brother    Colon cancer Neg Hx    Crohn's disease Neg Hx    Rectal cancer Neg Hx    Stomach cancer Neg Hx    Ulcerative colitis Neg Hx      Social History   Tobacco Use   Smoking status: Never   Smokeless tobacco: Never  Vaping Use   Vaping Use: Never used  Substance Use Topics   Alcohol use: Not Currently   Drug use: Never    Current Outpatient Medications  Medication Sig Dispense Refill   famotidine (PEPCID) 10 MG tablet Take 10 mg by mouth daily.     lisinopril (ZESTRIL) 10 MG tablet Take 10 mg by mouth daily.     dicyclomine (BENTYL) 10 MG capsule Take 1 capsule (10 mg total) by mouth 2 (two) times daily. 60 capsule 6   Current Facility-Administered Medications  Medication Dose Route Frequency Provider Last Rate Last Admin   0.9 %  sodium chloride infusion  500 mL Intravenous Continuous Jackquline Denmark, MD        No Known Allergies  Review of Systems:  Constitutional: Denies fever, chills, diaphoresis, appetite change and fatigue.  HEENT: Denies photophobia, eye pain, redness, hearing loss, ear pain, congestion, sore throat, rhinorrhea, sneezing, mouth sores, neck pain, neck stiffness and tinnitus.   Respiratory: Denies SOB, DOE, cough, chest tightness,  and wheezing.   Cardiovascular: Denies chest pain, palpitations and leg swelling.  Genitourinary: Denies dysuria, urgency, frequency, hematuria, flank pain and difficulty urinating.  Musculoskeletal: Denies myalgias, back pain, joint swelling, arthralgias and gait problem.  Skin: No rash.  Neurological: Denies dizziness, seizures, syncope,  weakness, light-headedness, numbness and headaches.  Hematological: Denies adenopathy. Easy bruising, personal or family bleeding history  Psychiatric/Behavioral: Situational anxiety, no depression     Physical Exam:    BP (!) 143/76   Pulse 67   Temp 97.7 F (36.5 C)   Ht '5\' 4"'$  (1.626 m)   Wt 169 lb (76.7 kg)   SpO2 99%   BMI 29.01 kg/m  Wt Readings from Last 3 Encounters:  08/25/22 169 lb (76.7 kg)  07/11/22 169 lb 8 oz (76.9 kg)  05/09/22 164 lb (74.4 kg)   Constitutional:  Well-developed, in no acute  distress. Psychiatric: Normal mood and affect. Behavior is normal. HEENT: Pupils normal.  Conjunctivae are normal. No scleral icterus. Neck supple.  Cardiovascular: Normal rate, regular rhythm. No edema Pulmonary/chest: Effort normal and breath sounds normal. No wheezing, rales or rhonchi. Abdominal: Soft, nondistended. Nontender. Bowel sounds active throughout. There are no masses palpable. No hepatomegaly. Rectal: Deferred Neurological: Alert and oriented to person place and time. Skin: Skin is warm and dry. No rashes noted.    Carmell Austria, MD 08/25/2022, 11:33 AM  Cc: Greig Right, MD

## 2022-08-25 NOTE — Op Note (Signed)
[facility] Patient Name: Alicia David Procedure Date: 08/25/2022 11:36 AM MRN: [id number] Endoscopist: Jackquline Denmark , MD, [id number] Age: 62 Referring MD:  Date of Birth: [birth year] Gender: Female Account #: [id number] Procedure:                Colonoscopy Indications:              High risk colon cancer surveillance: Personal                            history of colonic polyps Medicines:                Monitored Anesthesia Care Procedure:                Pre-Anesthesia Assessment:                           - Prior to the procedure, a History and Physical                            was performed, and patient medications and                            allergies were reviewed. The patient's tolerance of                            previous anesthesia was also reviewed. The risks                            and benefits of the procedure and the sedation                            options and risks were discussed with the patient.                            All questions were answered, and informed consent                            was obtained. Prior Anticoagulants: The patient has                            taken no anticoagulant or antiplatelet agents. ASA                            Grade Assessment: II - A patient with mild systemic                            disease. After reviewing the risks and benefits,                            the patient was deemed in satisfactory condition to                            undergo the procedure.  After obtaining informed consent, the colonoscope                            was passed under direct vision. Throughout the                            procedure, the patient's blood pressure, pulse, and                            oxygen saturations were monitored continuously. The                            Olympus PCF-H190DL (#[id number]) Colonoscope was                            introduced through the anus and advanced  to the 4                            cm into the ileum. The colonoscopy was performed                            without difficulty. The patient tolerated the                            procedure well. The quality of the bowel                            preparation was good. The terminal ileum, ileocecal                            valve, appendiceal orifice, and rectum were                            photographed. Scope In: 11:49:50 AM Scope Out: 12:00:12 PM Scope Withdrawal Time: 0 hours 8 minutes 38 seconds  Total Procedure Duration: 0 hours 10 minutes 22 seconds  Findings:                 A few (1-2) rare small-mouthed diverticula were                            found in the sigmoid colon.                           Non-bleeding internal hemorrhoids were found during                            retroflexion. The hemorrhoids were small and Grade                            I (internal hemorrhoids that do not prolapse).                           The terminal ileum appeared normal.  The exam was otherwise without abnormality on                            direct and retroflexion views. Complications:            No immediate complications. Estimated Blood Loss:     Estimated blood loss: none. Impression:               - Minimal sigmoid diverticulosis.                           - Non-bleeding internal hemorrhoids.                           - The examined portion of the ileum was normal.                           - The examination was otherwise normal on direct                            and retroflexion views.                           - No specimens collected. Recommendation:           - Patient has a contact number available for                            emergencies. The signs and symptoms of potential                            delayed complications were discussed with the                            patient. Return to normal activities tomorrow.                             Written discharge instructions were provided to the                            patient.                           - Resume previous diet.                           - Continue present medications.                           - Repeat colonoscopy in 10 years for screening                            purposes. Earlier, if with any new problems or                            change in family history.                           -  The findings and recommendations were discussed                            with the patient's family. Jackquline Denmark, MD 08/25/2022 12:04:07 PM This report has been signed electronically.

## 2022-08-25 NOTE — Progress Notes (Signed)
A/ox3, pleased with MAC, report to RN 

## 2022-08-26 ENCOUNTER — Telehealth: Payer: Self-pay

## 2022-08-26 NOTE — Telephone Encounter (Signed)
  Follow up Call-     08/25/2022   10:50 AM  Call back number  Post procedure Call Back phone  # [phone]  Permission to leave phone message Yes     Follow up call made. NALM

## 2023-06-29 DIAGNOSIS — R110 Nausea: Secondary | ICD-10-CM | POA: Diagnosis not present

## 2023-07-30 DIAGNOSIS — G3184 Mild cognitive impairment, so stated: Secondary | ICD-10-CM | POA: Diagnosis not present

## 2023-07-30 DIAGNOSIS — H8110 Benign paroxysmal vertigo, unspecified ear: Secondary | ICD-10-CM | POA: Diagnosis not present

## 2023-12-10 DIAGNOSIS — E669 Obesity, unspecified: Secondary | ICD-10-CM | POA: Diagnosis not present

## 2023-12-10 DIAGNOSIS — I10 Essential (primary) hypertension: Secondary | ICD-10-CM | POA: Diagnosis not present

## 2023-12-10 DIAGNOSIS — Z6832 Body mass index (BMI) 32.0-32.9, adult: Secondary | ICD-10-CM | POA: Diagnosis not present

## 2024-05-18 DIAGNOSIS — Z1231 Encounter for screening mammogram for malignant neoplasm of breast: Secondary | ICD-10-CM | POA: Diagnosis not present

## 2024-09-12 DIAGNOSIS — K219 Gastro-esophageal reflux disease without esophagitis: Secondary | ICD-10-CM | POA: Diagnosis not present

## 2024-09-12 DIAGNOSIS — I10 Essential (primary) hypertension: Secondary | ICD-10-CM | POA: Diagnosis not present

## 2024-09-12 DIAGNOSIS — Z6832 Body mass index (BMI) 32.0-32.9, adult: Secondary | ICD-10-CM | POA: Diagnosis not present

## 2024-09-12 DIAGNOSIS — E7800 Pure hypercholesterolemia, unspecified: Secondary | ICD-10-CM | POA: Diagnosis not present

## 2024-09-12 DIAGNOSIS — E669 Obesity, unspecified: Secondary | ICD-10-CM | POA: Diagnosis not present
